# Patient Record
Sex: Male | Born: 1990 | Race: Black or African American | Hispanic: No | Marital: Single | Smoking: Former smoker
Health system: Southern US, Community
[De-identification: ages and names within clinical notes are randomized; demographics above are authoritative.]

## PROBLEM LIST (undated history)

## (undated) HISTORY — PX: HERNIA REPAIR: SHX51

---

## 2013-04-19 ENCOUNTER — Emergency Department (HOSPITAL_COMMUNITY)
Admission: EM | Admit: 2013-04-19 | Discharge: 2013-04-20 | Payer: Self-pay | Attending: Emergency Medicine | Admitting: Emergency Medicine

## 2013-04-19 ENCOUNTER — Encounter (HOSPITAL_COMMUNITY): Payer: Self-pay | Admitting: *Deleted

## 2013-04-19 ENCOUNTER — Emergency Department (HOSPITAL_COMMUNITY): Payer: Self-pay

## 2013-04-19 DIAGNOSIS — Z87891 Personal history of nicotine dependence: Secondary | ICD-10-CM | POA: Insufficient documentation

## 2013-04-19 DIAGNOSIS — R1110 Vomiting, unspecified: Secondary | ICD-10-CM | POA: Insufficient documentation

## 2013-04-19 DIAGNOSIS — K4090 Unilateral inguinal hernia, without obstruction or gangrene, not specified as recurrent: Secondary | ICD-10-CM | POA: Insufficient documentation

## 2013-04-19 NOTE — ED Notes (Signed)
Pt presents to room A-4 with slow steady gait, in handcuffs and shackles accompanied by law enforcement. Pt from [employer].

## 2013-04-19 NOTE — ED Provider Notes (Signed)
CSN: [id number]     Arrival date & time 04/19/13  2157 History   First MD Initiated Contact with Patient 04/19/13 2253     Chief Complaint  Patient presents with  . Hernia   (Consider location/radiation/quality/duration/timing/severity/associated sxs/prior Treatment) HPI This is a 22 year old male jail inmate with a history of her right inguinal herniorrhaphy. He is here with a hernia of his left inguinal region that "pop out" today. It occurred when he was working. It is associated with throbbing pain which was moderate in intensity. He is still having some pain but the hernia is much less prominent now than it was earlier. He states he vomited 3 times today. He is still able to move his bowels. His abdomen is not distended.  History reviewed. No pertinent past medical history. Past Surgical History  Procedure Laterality Date  . Hernia repair     History reviewed. No pertinent family history. History  Substance Use Topics  . Smoking status: Former Games developer  . Smokeless tobacco: Not on file  . Alcohol Use: No    Review of Systems  All other systems reviewed and are negative.    Allergies  Review of patient's allergies indicates no known allergies.  Home Medications   Current Outpatient Rx  Name  Route  Sig  Dispense  Refill  . traMADol (ULTRAM) 50 MG tablet   Oral   Take 100 mg by mouth every 6 (six) hours as needed for pain.          BP 132/91  Pulse 72  Temp(Src) 98.1 F (36.7 C) (Oral)  Resp 16  SpO2 100%  Physical Exam General: Well-developed, well-nourished male in no acute distress; appearance consistent with age of record HENT: normocephalic; atraumatic Eyes: pupils equal, round and reactive to light; extraocular muscles intact Neck: supple Heart: regular rate and rhythm Lungs: clear to auscultation bilaterally Abdomen: soft; nondistended; nontender; no masses or hepatosplenomegaly; bowel sounds present GU: Tanner 4 male; left inguinal tenderness without  frank hernia palpated Extremities: No deformity; full range of motion; pulses normal Neurologic: Awake, alert and oriented; motor function intact in all extremities and symmetric; no facial droop Skin: Warm and dry Psychiatric: Normal mood and affect    ED Course  Procedures (including critical care time)  MDM  Nursing notes and vitals signs, including pulse oximetry, reviewed.  Summary of this visit's results, reviewed by myself:  Imaging Studies: Dg Abd Acute W/chest  04/19/2013   *RADIOLOGY REPORT*  Clinical Data: Left inguinal hernia, back pain  ACUTE ABDOMEN SERIES (ABDOMEN 2 VIEW & CHEST 1 VIEW)  Comparison: None.  Findings: The lungs are clear.  Cardiac and mediastinal contours within normal limits.  No acute osseous abnormality.  No organomegaly or calcification.  The bowel gas pattern is not obstructed.  Gas and stool are noted throughout the colon to the level of the rectum.  No gas projecting over the inguinal regions to suggest herniation of bowel.  No free air on the upright view.  IMPRESSION:  1.  No acute cardiopulmonary process. 2.  Unremarkable, nonobstructed bowel gas pattern.   Original Report Authenticated By: Malachy Moan, M.D.   11:59 PM The patient shows no evidence of obstruction at this time. His hernia is reduced. Will refer to [facility] surgery for definitive treatment. He was advised to return to the ED should his hernia become nonreducible.     Carlisle Beers Jackey Housey, MD 04/20/13 0000

## 2013-04-19 NOTE — ED Notes (Signed)
Pt states he has a hernia in his left lower abd that has "popped" out today.  Pt has history of hernia in the past

## 2013-04-20 MED ORDER — IBUPROFEN 800 MG PO TABS
ORAL_TABLET | ORAL | Status: DC
Start: 2013-04-20 — End: 2015-03-27

## 2014-12-07 ENCOUNTER — Ambulatory Visit: Payer: Self-pay

## 2015-03-27 ENCOUNTER — Emergency Department (HOSPITAL_BASED_OUTPATIENT_CLINIC_OR_DEPARTMENT_OTHER)
Admission: EM | Admit: 2015-03-27 | Discharge: 2015-03-27 | Disposition: A | Discharge status: Home / Self Care | Payer: Self-pay | Attending: Emergency Medicine | Admitting: Emergency Medicine

## 2015-03-27 ENCOUNTER — Emergency Department (HOSPITAL_BASED_OUTPATIENT_CLINIC_OR_DEPARTMENT_OTHER): Payer: Self-pay

## 2015-03-27 ENCOUNTER — Encounter (HOSPITAL_BASED_OUTPATIENT_CLINIC_OR_DEPARTMENT_OTHER): Payer: Self-pay | Admitting: *Deleted

## 2015-03-27 DIAGNOSIS — M546 Pain in thoracic spine: Secondary | ICD-10-CM | POA: Insufficient documentation

## 2015-03-27 DIAGNOSIS — R091 Pleurisy: Secondary | ICD-10-CM | POA: Insufficient documentation

## 2015-03-27 DIAGNOSIS — Z87891 Personal history of nicotine dependence: Secondary | ICD-10-CM | POA: Insufficient documentation

## 2015-03-27 DIAGNOSIS — R05 Cough: Secondary | ICD-10-CM | POA: Insufficient documentation

## 2015-03-27 MED ORDER — IBUPROFEN 800 MG PO TABS
800.0000 mg | ORAL_TABLET | Freq: Once | ORAL | Status: AC
  Administered 2015-03-27: 800 mg via ORAL
  Filled 2015-03-27: qty 1

## 2015-03-27 MED ORDER — IBUPROFEN 600 MG PO TABS: 600.0000 mg | ORAL_TABLET | Freq: Three times a day (TID) | ORAL | Status: AC | PRN

## 2015-03-27 NOTE — ED Notes (Signed)
Pt denies any trauma, injury etc to area, no bruising or discoloration noted at area of pain/ tenderness

## 2015-03-27 NOTE — ED Provider Notes (Signed)
CSN: [id number]     Arrival date & time 03/27/15  0854 History   First MD Initiated Contact with Patient 03/27/15 [phone]     Chief Complaint  Patient presents with  . rib pain      HPI Patient presents with left upper back pain and left rib pain present over the past week.  He states productive cough and questions whether or not it was brown in nature.  No fevers or chills.  Denies shortness of breath.  No history DVT or pulmonary embolism.  Denies abdominal pain.  No urinary complaints.  Denies nausea vomiting diarrhea.  Pain is mild in severity.  He has not tried any pain medication.   History reviewed. No pertinent past medical history. Past Surgical History  Procedure Laterality Date  . Hernia repair     No family history on file. Social History  Substance Use Topics  . Smoking status: Former Smoker    Quit date: 03/26/2013  . Smokeless tobacco: None  . Alcohol Use: No    Review of Systems  All other systems reviewed and are negative.     Allergies  Review of patient's allergies indicates no known allergies.  Home Medications   Prior to Admission medications   Medication Sig Start Date End Date Taking? Authorizing Provider  ibuprofen (ADVIL,MOTRIN) 800 MG tablet Take 1 tablet 3 times daily with a meal as needed for pain. 04/20/13   John Molpus, MD  traMADol (ULTRAM) 50 MG tablet Take 100 mg by mouth every 6 (six) hours as needed for pain.    Historical Provider, MD   BP 133/81 mmHg  Pulse 65  Temp(Src) 98.3 F (36.8 C) (Oral)  Resp 18  Ht  (1.753 m)  Wt 140 lb (63.504 kg)  BMI 20.67 kg/m2  SpO2 99% Physical Exam  Constitutional: He is oriented to person, place, and time. He appears well-developed and well-nourished.  HENT:  Head: Normocephalic and atraumatic.  Eyes: EOM are normal.  Neck: Normal range of motion.  Cardiovascular: Normal rate, regular rhythm, normal heart sounds and intact distal pulses.   Pulmonary/Chest: Effort normal and breath sounds  normal. No respiratory distress.  Abdominal: Soft. He exhibits no distension. There is no tenderness.  Musculoskeletal: Normal range of motion.  Neurological: He is alert and oriented to person, place, and time.  Skin: Skin is warm and dry.  Psychiatric: He has a normal mood and affect. Judgment normal.  Nursing note and vitals reviewed.   ED Course  Procedures (including critical care time) Labs Review Labs Reviewed - No data to display  Imaging Review Dg Chest 2 View  03/27/2015   CLINICAL DATA:  LEFT lateral posterior rib pain with inspiration. Productive cough with sputum. Shortness of breath.  EXAM: CHEST  2 VIEW  COMPARISON:  04/19/2013.  FINDINGS: The heart size and mediastinal contours are within normal limits. Both lungs are clear. The visualized skeletal structures are unremarkable.  IMPRESSION: No active cardiopulmonary disease.  Stable exam.   Electronically Signed   By: Elsie Stain M.D.   On: 03/27/2015 09:30   I have personally reviewed and evaluated these images and lab results as part of my medical decision-making.   EKG Interpretation None      MDM   Final diagnoses:  None    Likely left-sided pleurisy with cough.  No longer smoking.  Chest x-ray without pneumonia.  Discharge home on anti-inflammatories.    Azalia Bilis, MD 03/27/15 [phone]

## 2015-03-27 NOTE — Discharge Instructions (Signed)

## 2015-03-27 NOTE — ED Notes (Signed)
C/o rib pain, points to area of left flank, onset one week ago, states has prod cough, thick brown secretions

## 2015-09-14 DEATH — deceased

## 2016-10-26 IMAGING — DX DG CHEST 2V
2 series · 2 of 2 positions shown · non-contrast
Comparison: 04/19/2013.

CLINICAL DATA: LEFT lateral posterior rib pain with inspiration.
Productive cough with sputum. Shortness of breath.

EXAM:
CHEST  2 VIEW

[chest pa]
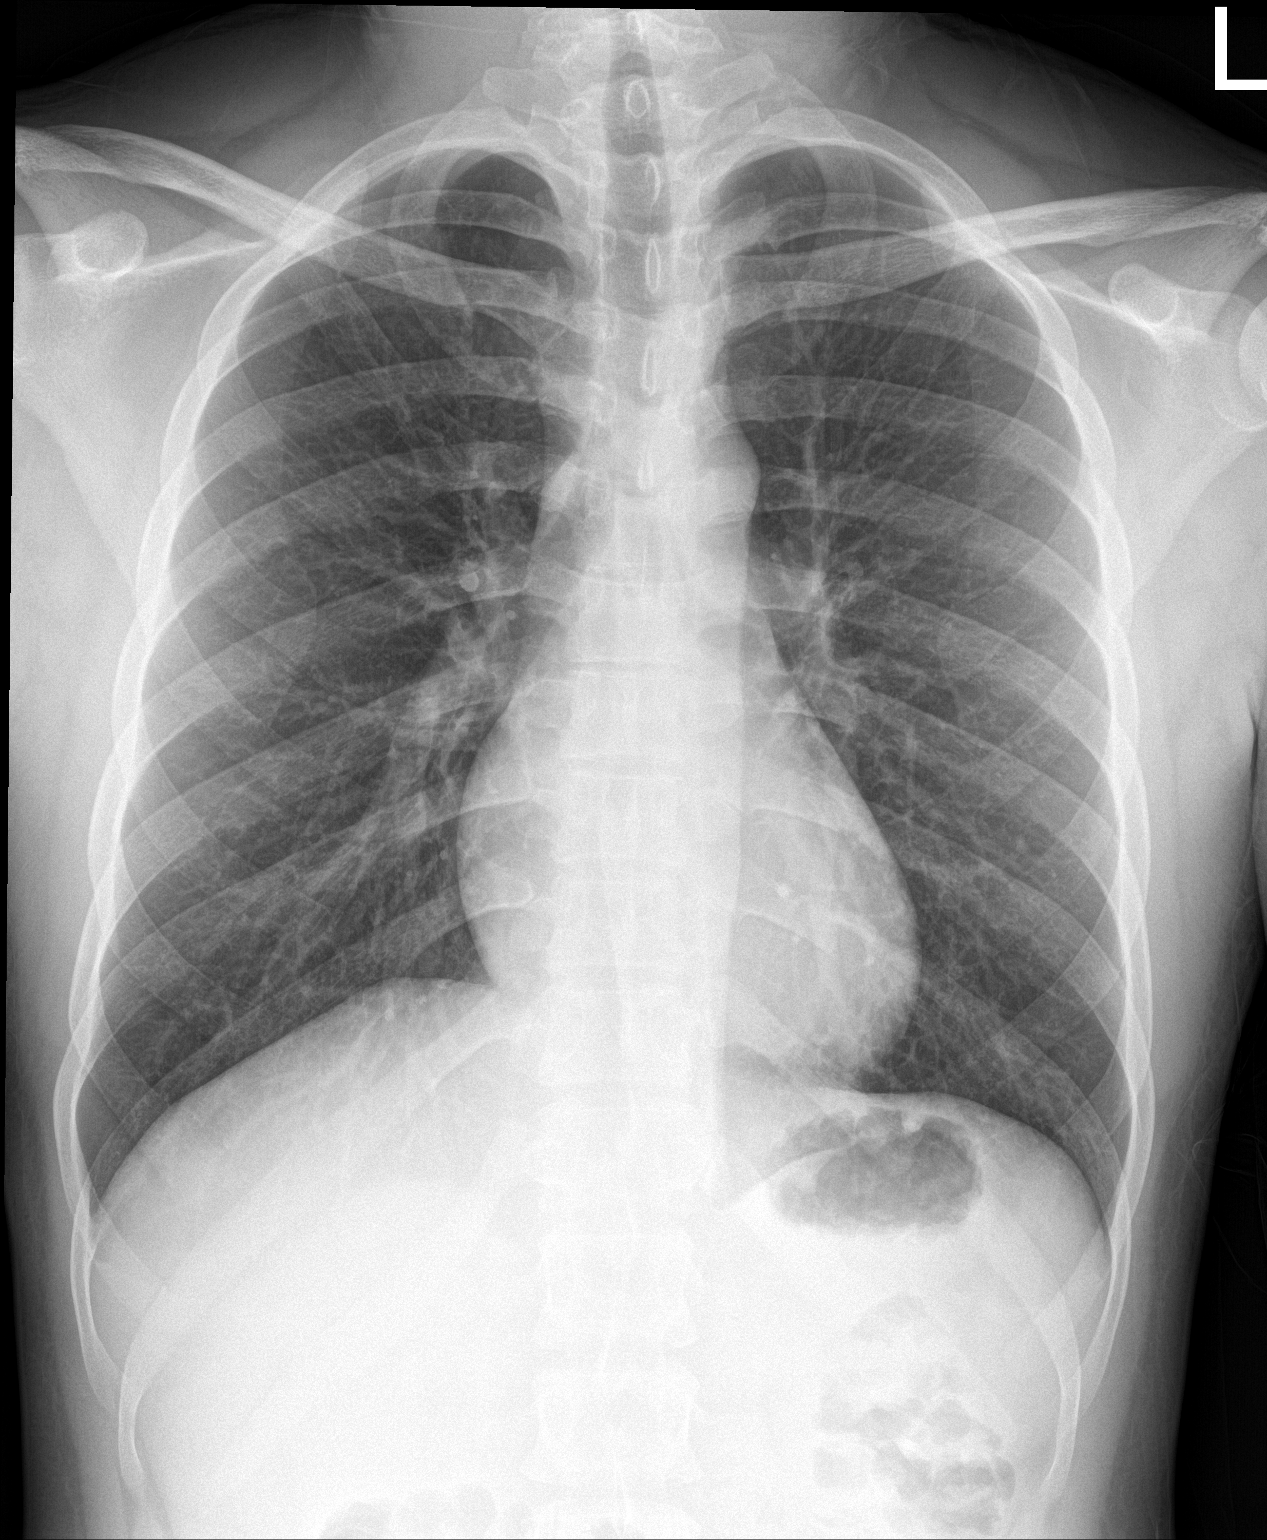

[chest lat]
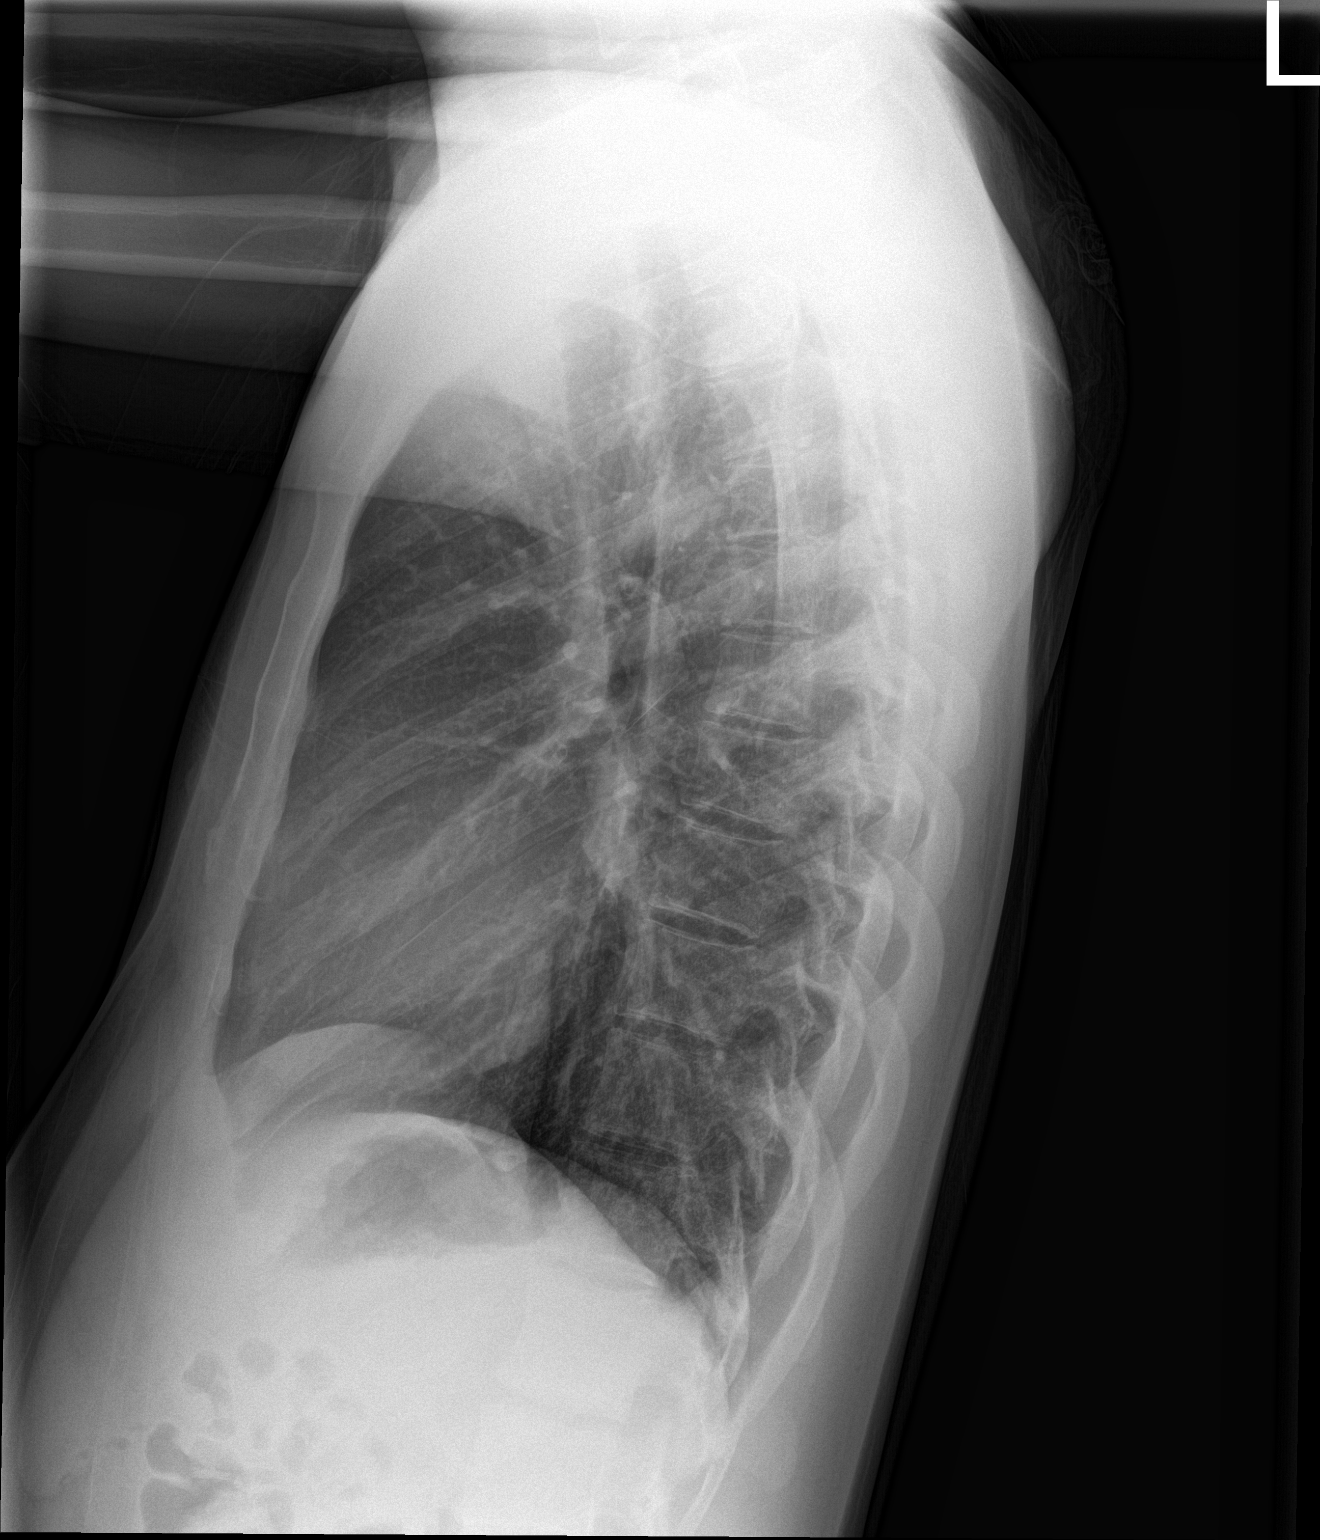

[2 of 2 positions shown; findings below may reference images not displayed]

FINDINGS: The heart size and mediastinal contours are within normal limits.
Both lungs are clear. The visualized skeletal structures are
unremarkable.
IMPRESSION: No active cardiopulmonary disease.  Stable exam.
